# Patient Record
Sex: Male | Born: 1980 | Race: White | Hispanic: No | Marital: Single | Smoking: Current every day smoker
Health system: Southern US, Community
[De-identification: ages and names within clinical notes are randomized; demographics above are authoritative.]

---

## 2013-01-31 ENCOUNTER — Emergency Department (HOSPITAL_BASED_OUTPATIENT_CLINIC_OR_DEPARTMENT_OTHER): Payer: Worker's Compensation

## 2013-01-31 ENCOUNTER — Encounter (HOSPITAL_BASED_OUTPATIENT_CLINIC_OR_DEPARTMENT_OTHER): Payer: Self-pay | Admitting: Emergency Medicine

## 2013-01-31 ENCOUNTER — Emergency Department (HOSPITAL_BASED_OUTPATIENT_CLINIC_OR_DEPARTMENT_OTHER)
Admission: EM | Admit: 2013-01-31 | Discharge: 2013-01-31 | Disposition: A | Discharge status: Home / Self Care | Payer: Worker's Compensation | Attending: Emergency Medicine | Admitting: Emergency Medicine

## 2013-01-31 DIAGNOSIS — Z23 Encounter for immunization: Secondary | ICD-10-CM | POA: Insufficient documentation

## 2013-01-31 DIAGNOSIS — S61409A Unspecified open wound of unspecified hand, initial encounter: Secondary | ICD-10-CM | POA: Insufficient documentation

## 2013-01-31 DIAGNOSIS — Y929 Unspecified place or not applicable: Secondary | ICD-10-CM | POA: Insufficient documentation

## 2013-01-31 DIAGNOSIS — W298XXA Contact with other powered powered hand tools and household machinery, initial encounter: Secondary | ICD-10-CM | POA: Insufficient documentation

## 2013-01-31 DIAGNOSIS — F17200 Nicotine dependence, unspecified, uncomplicated: Secondary | ICD-10-CM | POA: Insufficient documentation

## 2013-01-31 DIAGNOSIS — S61412A Laceration without foreign body of left hand, initial encounter: Secondary | ICD-10-CM

## 2013-01-31 DIAGNOSIS — Y939 Activity, unspecified: Secondary | ICD-10-CM | POA: Insufficient documentation

## 2013-01-31 MED ORDER — CEPHALEXIN 250 MG PO CAPS
500.0000 mg | ORAL_CAPSULE | Freq: Once | ORAL | Status: AC
  Administered 2013-01-31: 500 mg via ORAL
  Filled 2013-01-31: qty 2

## 2013-01-31 MED ORDER — HYDROCODONE-ACETAMINOPHEN 5-325 MG PO TABS
2.0000 | ORAL_TABLET | Freq: Once | ORAL | Status: AC
  Administered 2013-01-31: 2 via ORAL
  Filled 2013-01-31: qty 2

## 2013-01-31 MED ORDER — CEPHALEXIN 500 MG PO CAPS: 500.0000 mg | ORAL_CAPSULE | Freq: Four times a day (QID) | ORAL | Status: AC

## 2013-01-31 MED ORDER — HYDROCODONE-ACETAMINOPHEN 5-325 MG PO TABS: 2.0000 | ORAL_TABLET | ORAL | Status: AC | PRN

## 2013-01-31 MED ORDER — TETANUS-DIPHTH-ACELL PERTUSSIS 5-2.5-18.5 LF-MCG/0.5 IM SUSP
0.5000 mL | Freq: Once | INTRAMUSCULAR | Status: AC
  Administered 2013-01-31: 0.5 mL via INTRAMUSCULAR
  Filled 2013-01-31: qty 0.5

## 2013-01-31 MED ORDER — LIDOCAINE HCL 2 % IJ SOLN
INTRAMUSCULAR | Status: AC
  Administered 2013-01-31: 40 mg
  Filled 2013-01-31: qty 20

## 2013-01-31 NOTE — ED Notes (Signed)
Lac to back of hand w miter saw   Bleeding controlled

## 2013-01-31 NOTE — ED Notes (Addendum)
Left hand laceration on saw-questionable blade vs wood kick back-jagged lac noted to dorsal side-bleeding controlled-gauze wrap placed in triage

## 2013-01-31 NOTE — ED Provider Notes (Signed)
CSN: [id number]     Arrival date & time 01/31/13  1824 History   First MD Initiated Contact with Patient 01/31/13 2229     Chief Complaint  Patient presents with  . Extremity Laceration   (Consider location/radiation/quality/duration/timing/severity/associated sxs/prior Treatment) Patient is a 32 y.o. male presenting with skin laceration. The history is provided by the patient. No language interpreter was used.  Laceration Location:  Hand Length (cm):  3 Depth:  Through underlying tissue Quality: straight   Bleeding: controlled with pressure   Injury mechanism: miter saw. Pain details:    Quality:  Aching   Severity:  Moderate Relieved by:  Nothing Worsened by:  Nothing tried Tetanus status:  Up to date   History reviewed. No pertinent past medical history. History reviewed. No pertinent past surgical history. No family history on file. History  Substance Use Topics  . Smoking status: Current Every Day Smoker  . Smokeless tobacco: Not on file  . Alcohol Use: No    Review of Systems  Skin: Positive for wound.  All other systems reviewed and are negative.    Allergies  Review of patient's allergies indicates no known allergies.  Home Medications  No current outpatient prescriptions on file. BP 165/83  Pulse 88  Temp(Src) 98.7 F (37.1 C) (Oral)  Resp 16  Ht 5\' 10"  (1.778 m)  Wt 250 lb (113.399 kg)  BMI 35.87 kg/m2  SpO2 100% Physical Exam  Nursing note and vitals reviewed. Constitutional: He is oriented to person, place, and time. He appears well-developed and well-nourished.  HENT:  Head: Normocephalic and atraumatic.  Musculoskeletal: He exhibits tenderness.  3cm laceration dorsal aspect hand below 3rd   Neurological: He is alert and oriented to person, place, and time. He has normal reflexes.  Skin: Skin is warm.  Psychiatric: He has a normal mood and affect.    ED Course  LACERATION REPAIR Date/Time: 01/31/2013 10:58 PM Performed by: Elson Areas Authorized by: Elson Areas Consent: Verbal consent obtained. Consent given by: patient Patient understanding: patient states understanding of the procedure being performed Patient identity confirmed: verbally with patient Body area: upper extremity Location details: left hand Laceration length: 3 cm Foreign bodies: no foreign bodies Tendon involvement: complex Nerve involvement: none Vascular damage: no Anesthesia: local infiltration Local anesthetic: lidocaine 2% without epinephrine Preparation: Patient was prepped and draped in the usual sterile fashion. Irrigation solution: saline Irrigation method: syringe Amount of cleaning: standard Debridement: none Degree of undermining: none Skin closure: 5-0 Prolene Number of sutures: 4 Technique: simple Approximation: loose Patient tolerance: Patient tolerated the procedure well with no immediate complications. Comments: Pt has full extensor tendon laceration.  I spoke to Dr. Izora Ribas who advised close skin loosely   (including critical care time) Labs Review Labs Reviewed - No data to display Imaging Review Dg Hand Complete Left  01/31/2013   CLINICAL DATA:  Laceration  EXAM: LEFT HAND - COMPLETE 3+ VIEW  COMPARISON:  None.  FINDINGS: There is no evidence of fracture or dislocation. There is no evidence of arthropathy or other focal bone abnormality. Soft tissues are unremarkable. No subcutaneous gas or radiodense foreign body.  IMPRESSION: Negative.   Electronically Signed   By: Oley Balm M.D.   On: 01/31/2013 20:02    EKG Interpretation   None       MDM   1. Laceration of left hand with complication, initial encounter    Keflex and hydrocodone   Pt placed in a splint.  I advised call Dr. Izora Ribas in the am   Elson Areas, PA-C 01/31/13 2305  Lonia Skinner [place], [place] 01/31/13 [phone]

## 2013-02-01 NOTE — ED Provider Notes (Signed)
Medical screening examination/treatment/procedure(s) were performed by non-physician practitioner and as supervising physician I was immediately available for consultation/collaboration.  EKG Interpretation   None         Pritika Alvarez S Jermery Caratachea, MD 02/01/13 1045 

## 2015-08-21 IMAGING — CR DG HAND COMPLETE 3+V*L*
3 series · 3 of 3 positions shown · non-contrast
Comparison: None.

CLINICAL DATA: Laceration

EXAM:
LEFT HAND - COMPLETE 3+ VIEW

[x hand pa left]
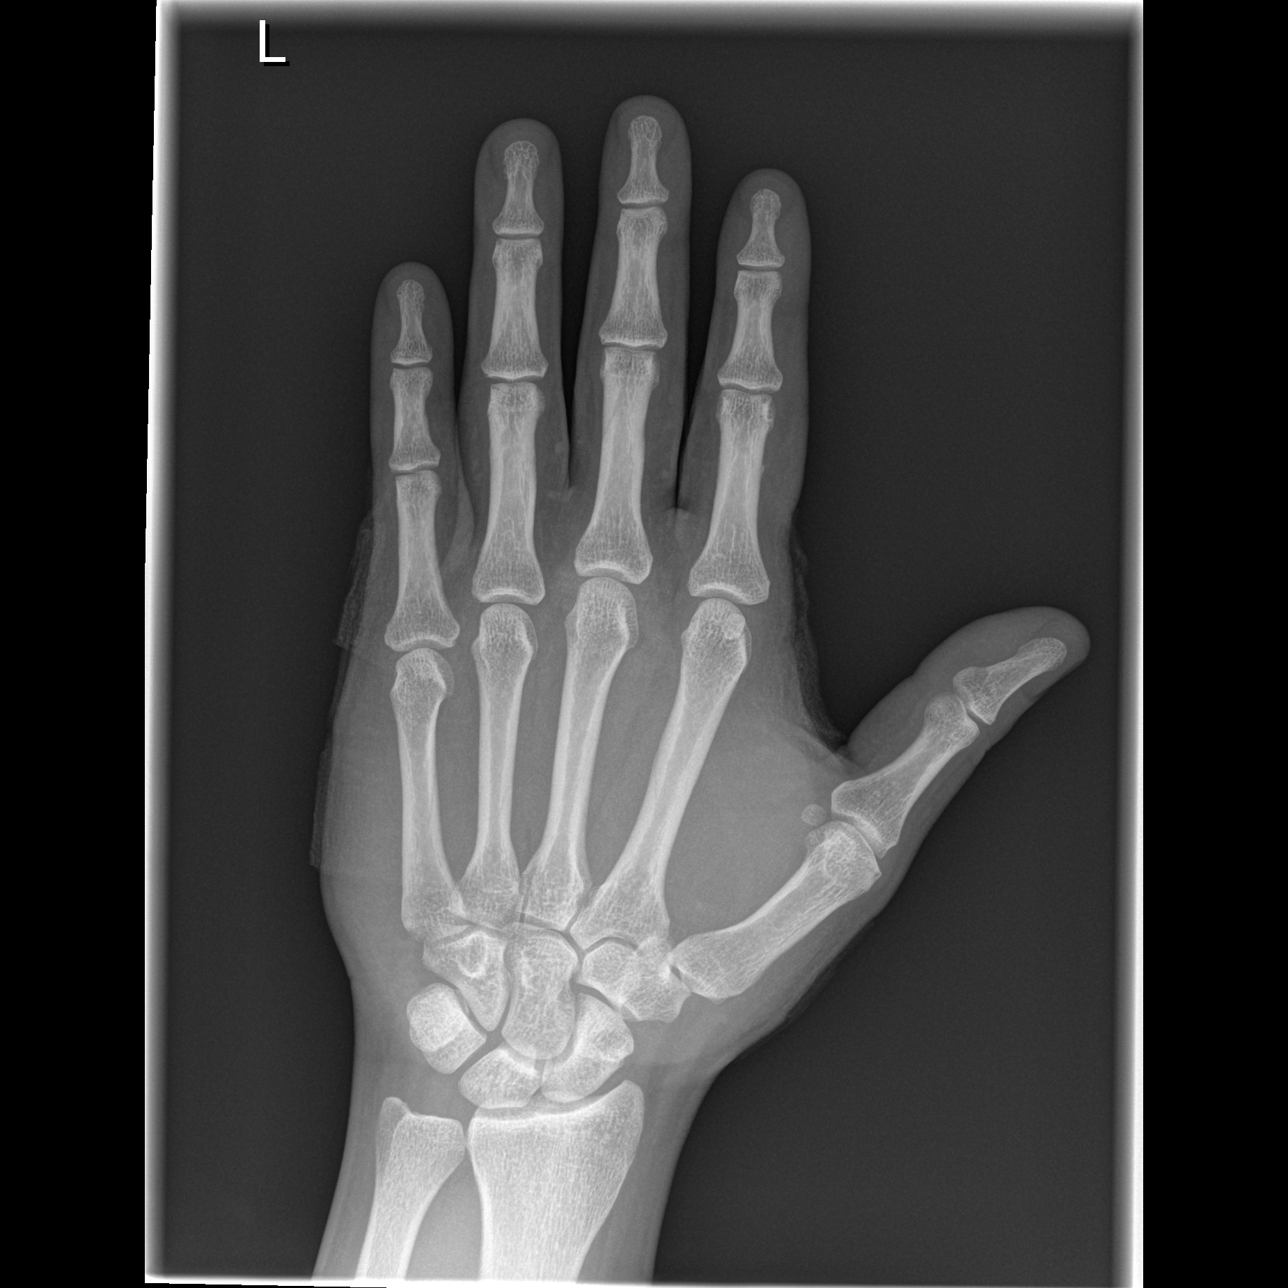

[x hand oblique left]
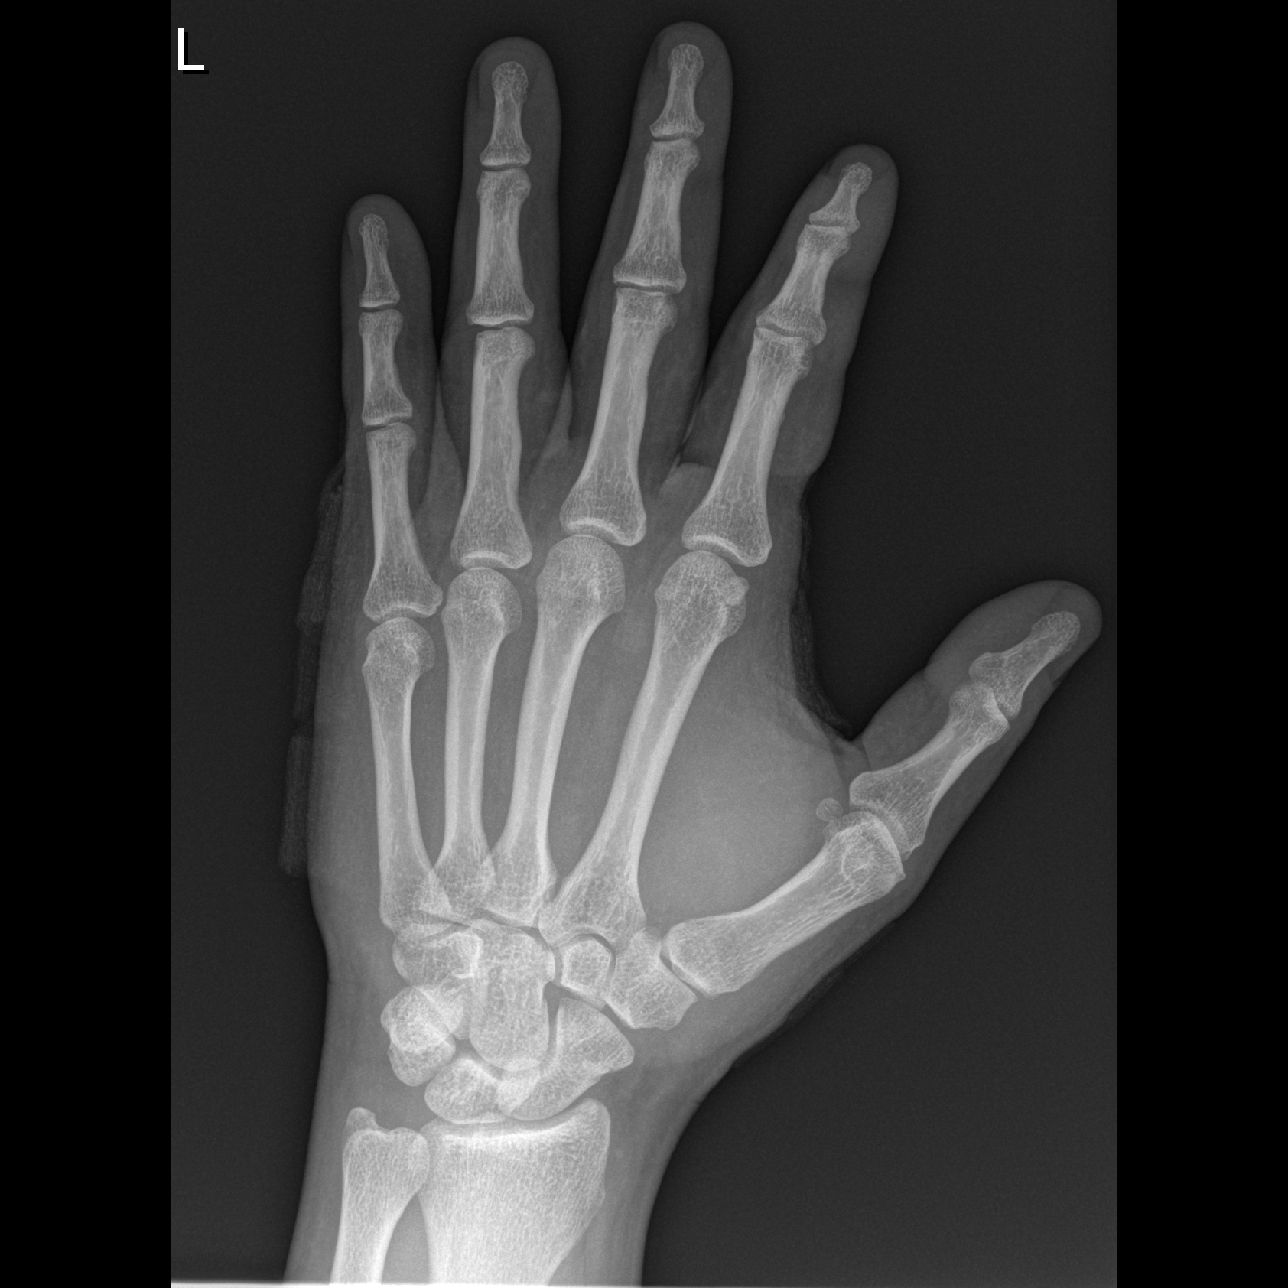

[x hand lat left *]
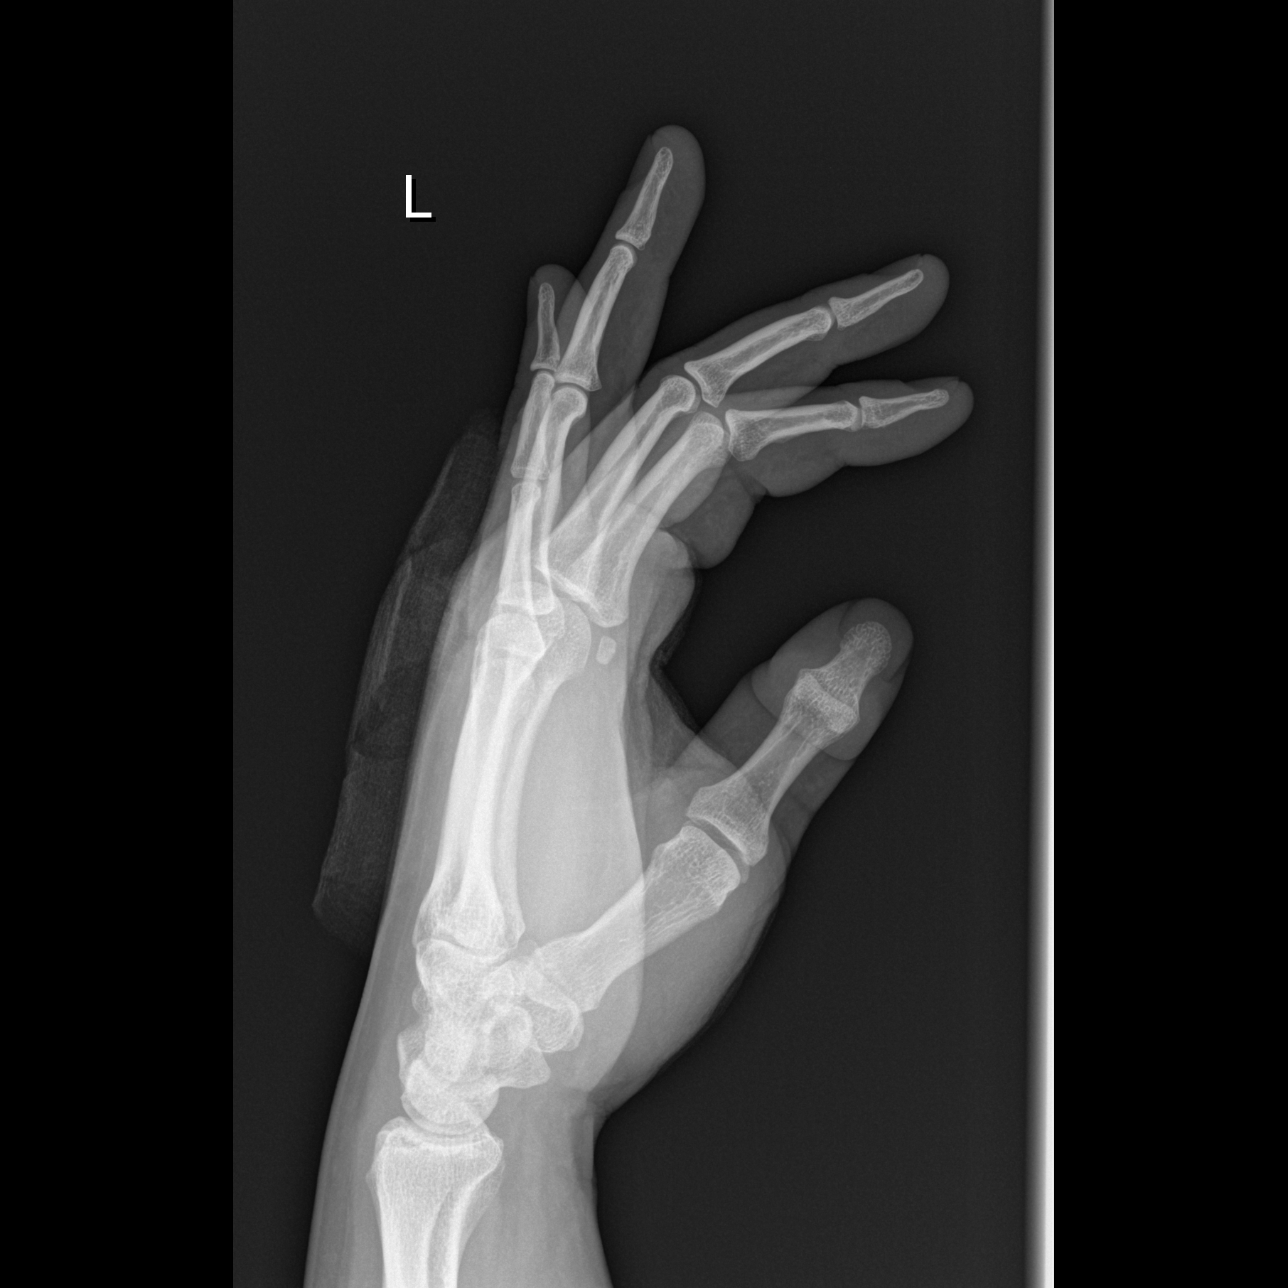

[3 of 3 positions shown; findings below may reference images not displayed]

FINDINGS: There is no evidence of fracture or dislocation. There is no
evidence of arthropathy or other focal bone abnormality. Soft
tissues are unremarkable. No subcutaneous gas or radiodense foreign
body.
IMPRESSION: Negative.

## 2018-02-09 ENCOUNTER — Other Ambulatory Visit: Payer: Self-pay

## 2018-02-09 ENCOUNTER — Emergency Department (HOSPITAL_COMMUNITY): Payer: 59

## 2018-02-09 ENCOUNTER — Emergency Department (HOSPITAL_COMMUNITY)
Admission: EM | Admit: 2018-02-09 | Discharge: 2018-02-09 | Disposition: A | Discharge status: Home / Self Care | Payer: 59 | Attending: Emergency Medicine | Admitting: Emergency Medicine

## 2018-02-09 ENCOUNTER — Encounter (HOSPITAL_COMMUNITY): Payer: Self-pay | Admitting: *Deleted

## 2018-02-09 DIAGNOSIS — Y939 Activity, unspecified: Secondary | ICD-10-CM | POA: Diagnosis not present

## 2018-02-09 DIAGNOSIS — S43005A Unspecified dislocation of left shoulder joint, initial encounter: Secondary | ICD-10-CM

## 2018-02-09 DIAGNOSIS — F17210 Nicotine dependence, cigarettes, uncomplicated: Secondary | ICD-10-CM | POA: Insufficient documentation

## 2018-02-09 DIAGNOSIS — Y929 Unspecified place or not applicable: Secondary | ICD-10-CM | POA: Insufficient documentation

## 2018-02-09 DIAGNOSIS — X58XXXA Exposure to other specified factors, initial encounter: Secondary | ICD-10-CM | POA: Diagnosis not present

## 2018-02-09 DIAGNOSIS — S43015A Anterior dislocation of left humerus, initial encounter: Secondary | ICD-10-CM | POA: Insufficient documentation

## 2018-02-09 DIAGNOSIS — Y999 Unspecified external cause status: Secondary | ICD-10-CM | POA: Diagnosis not present

## 2018-02-09 MED ORDER — PROPOFOL 10 MG/ML IV BOLUS
INTRAVENOUS | Status: AC | PRN
  Administered 2018-02-09: 60 mg via INTRAVENOUS

## 2018-02-09 MED ORDER — FENTANYL CITRATE (PF) 100 MCG/2ML IJ SOLN
50.0000 ug | Freq: Once | INTRAMUSCULAR | Status: AC
  Administered 2018-02-09: 50 ug via INTRAVENOUS
  Filled 2018-02-09: qty 2

## 2018-02-09 MED ORDER — PROPOFOL 10 MG/ML IV BOLUS
0.5000 mg/kg | Freq: Once | INTRAVENOUS | Status: AC
  Administered 2018-02-09: 59 mg via INTRAVENOUS
  Filled 2018-02-09: qty 20

## 2018-02-09 NOTE — ED Notes (Signed)
Bed: WA07 Expected date:  Expected time:  Means of arrival:  Comments: Triage 1  

## 2018-02-09 NOTE — Discharge Instructions (Signed)
Please keep sling in place.  Use cold therapy and ibuprofen. Call your orthopedist for recheck next week.

## 2018-02-09 NOTE — ED Provider Notes (Signed)
[facility]-EMERGENCY DEPT Provider Note   CSN: [id number] Arrival date & time: 02/09/18  1539     History   Chief Complaint Chief Complaint  Patient presents with  . shoulder dislocation    HPI Miguel Nguyen is a 38 y.o. male.  HPI  38 year old male presents today complaining of left shoulder pain and dislocation.  He states he has dislocated multiple times over the past 10 to 15 years and normally is able to relocate himself.  He has been seen by an orthopedist although he cannot recall the name.  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  He feels like he is having some tingling in his fingers.  He denies any other injury.  History reviewed. No pertinent past medical history.  There are no active problems to display for this patient.   History reviewed. No pertinent surgical history.      Home Medications    Prior to Admission medications   Medication Sig Start Date End Date Taking? Authorizing Provider  cephALEXin (KEFLEX) 500 MG capsule Take 1 capsule (500 mg total) by mouth 4 (four) times daily. 01/31/13   Elson Areas, PA-C  HYDROcodone-acetaminophen (NORCO/VICODIN) 5-325 MG per tablet Take 2 tablets by mouth every 4 (four) hours as needed. 01/31/13   Elson Areas, PA-C    Family History No family history on file.  Social History Social History   Tobacco Use  . Smoking status: Current Every Day Smoker  . Smokeless tobacco: Never Used  Substance Use Topics  . Alcohol use: No  . Drug use: No     Allergies   Patient has no known allergies.   Review of Systems Review of Systems  All other systems reviewed and are negative.    Physical Exam Updated Vital Signs BP (!) 188/122   Pulse (!) 104   Temp 98.8 F (37.1 C) (Oral)   Resp (!) 22   Ht 1.829 m (6')   Wt 117.9 kg   SpO2 100%   BMI 35.26 kg/m   Physical Exam Vitals signs and nursing note reviewed.  Constitutional:      General: He is in acute distress.    HENT:     Head: Normocephalic.     Nose: Nose normal.     Mouth/Throat:     Mouth: Mucous membranes are moist.  Eyes:     Pupils: Pupils are equal, round, and reactive to light.  Neck:     Musculoskeletal: Normal range of motion.  Musculoskeletal:     Comments: Left shoulder with deformity Left arm held in internal rotation and abduction Pulses intact Sensation intact throughout upper arm Full active range of motion of fingers and wrist  Skin:    General: Skin is warm and dry.     Capillary Refill: Capillary refill takes less than 2 seconds.  Neurological:     General: No focal deficit present.     Mental Status: He is alert.  Psychiatric:        Mood and Affect: Mood normal.      ED Treatments / Results  Labs (all labs ordered are listed, but only abnormal results are displayed) Labs Reviewed - No data to display  EKG None  Radiology Dg Shoulder Left  Result Date: 02/09/2018 CLINICAL DATA:  Left shoulder pain EXAM: LEFT SHOULDER - 2+ VIEW COMPARISON:  None. FINDINGS: Anterior dislocation of the left humeral head with respect to the glenoid fossa. Questionable fracture lucency in the  distal clavicle on one view. IMPRESSION: 1. Anterior dislocation of the left humeral head with respect to the glenoid fossa 2. Questionable fracture lucency in the distal left clavicle on one view. Electronically Signed   By: Jasmine PangKim  Fujinaga M.D.   On: 02/09/2018 16:05    Procedures .Sedation Date/Time: 02/09/2018 5:06 PM Performed by: Margarita Grizzleay, Chinmay Squier, MD Authorized by: Margarita Grizzleay, Maryann Mccall, MD   Consent:    Consent obtained:  Verbal   Consent given by:  Patient   Risks discussed:  Allergic reaction, dysrhythmia, inadequate sedation, nausea, prolonged hypoxia resulting in organ damage, prolonged sedation necessitating reversal, respiratory compromise necessitating ventilatory assistance and intubation and vomiting   Alternatives discussed:  Analgesia without sedation, anxiolysis and regional  anesthesia Universal protocol:    Procedure explained and questions answered to patient or proxy's satisfaction: yes     Relevant documents present and verified: yes     Test results available and properly labeled: yes     Imaging studies available: yes     Required blood products, implants, devices, and special equipment available: yes     Site/side marked: yes     Immediately prior to procedure a time out was called: yes     Patient identity confirmation method:  Verbally with patient and arm band Indications:    Procedure necessitating sedation performed by:  Physician performing sedation Pre-sedation assessment:    Time since last food or drink:  4 hours   NPO status caution: urgency dictates proceeding with non-ideal NPO status     ASA classification: class 1 - normal, healthy patient     Neck mobility: normal     Mouth opening:  3 or more finger widths   Thyromental distance:  4 finger widths   Mallampati score:  I - soft palate, uvula, fauces, pillars visible   Pre-sedation assessments completed and reviewed: airway patency, cardiovascular function, hydration status, mental status, nausea/vomiting, pain level, respiratory function and temperature     Pre-sedation assessment completed:  02/09/2018 6:04 PM Immediate pre-procedure details:    Reassessment: Patient reassessed immediately prior to procedure     Reviewed: vital signs, relevant labs/tests and NPO status     Verified: bag valve mask available, emergency equipment available, intubation equipment available, IV patency confirmed, oxygen available and suction available   Procedure details (see MAR for exact dosages):    Preoxygenation:  Nasal cannula   Sedation:  Propofol   Intra-procedure monitoring:  Blood pressure monitoring, cardiac monitor, continuous pulse oximetry, frequent LOC assessments, frequent vital sign checks and continuous capnometry   Intra-procedure events: none     Total Provider sedation time (minutes):   15 Post-procedure details:    Attendance: Constant attendance by certified staff until patient recovered     Recovery: Patient returned to pre-procedure baseline     Post-sedation assessments completed and reviewed: airway patency, cardiovascular function, hydration status, mental status, nausea/vomiting, pain level, respiratory function and temperature     Patient is stable for discharge or admission: yes     Patient tolerance:  Tolerated well, no immediate complications .Ortho Injury Treatment Date/Time: 02/09/2018 5:07 PM Performed by: Margarita Grizzleay, Maleaha Hughett, MD Authorized by: Margarita Grizzleay, Keldric Poyer, MD   Consent:    Consent obtained:  Written   Consent given by:  Patient   Risks discussed:  Fracture, nerve damage, vascular damage and irreducible dislocation   Alternatives discussed:  No treatment Universal protocol:    Immediately prior to procedure a time out was called: yes     Patient identity  confirmed:  Verbally with patientInjury location: upper arm Injury type: dislocation Pre-procedure neurovascular assessment: neurovascularly intact Pre-procedure distal perfusion: normal Pre-procedure neurological function: normal Pre-procedure range of motion: reduced  Anesthesia: Local anesthesia used: no  Patient sedated: Yes. Refer to sedation procedure documentation for details of sedation. Manipulation performed: no Immobilization: sling Supplies used: sling. Post-procedure neurovascular assessment: post-procedure neurovascularly intact Post-procedure distal perfusion: normal Post-procedure neurological function: normal Post-procedure range of motion: improved Patient tolerance: Patient tolerated the procedure well with no immediate complications    (including critical care time)  Medications Ordered in ED Medications  fentaNYL (SUBLIMAZE) injection 50 mcg (has no administration in time range)  propofol (DIPRIVAN) 10 mg/mL bolus/IV push 59 mg (has no administration in time range)      Initial Impression / Assessment and Plan / ED Course  I have reviewed the triage vital signs and the nursing notes.  Pertinent labs & imaging results that were available during my care of the patient were reviewed by me and considered in my medical decision making (see chart for details).    Postreduction x-Mung Rinker shows good reduction  Final Clinical Impressions(s) / ED Diagnoses   Final diagnoses:  Shoulder dislocation, left, initial encounter    ED Discharge Orders    None       Margarita Grizzle, MD 02/09/18 [phone]

## 2018-02-09 NOTE — ED Triage Notes (Signed)
Reaching overhead, left shoulder popped out. He has had this happen before and was able to pop it back in.

## 2020-08-29 IMAGING — DX DG SHOULDER 2+V*L*
2 series · 2 of 2 positions shown · non-contrast
Comparison: [DATE] [DATE], [DATE] [DATE] p.m..

CLINICAL DATA: Status post reduction of left shoulder.

EXAM:
LEFT SHOULDER - 2+ VIEW

[shoulder obl]
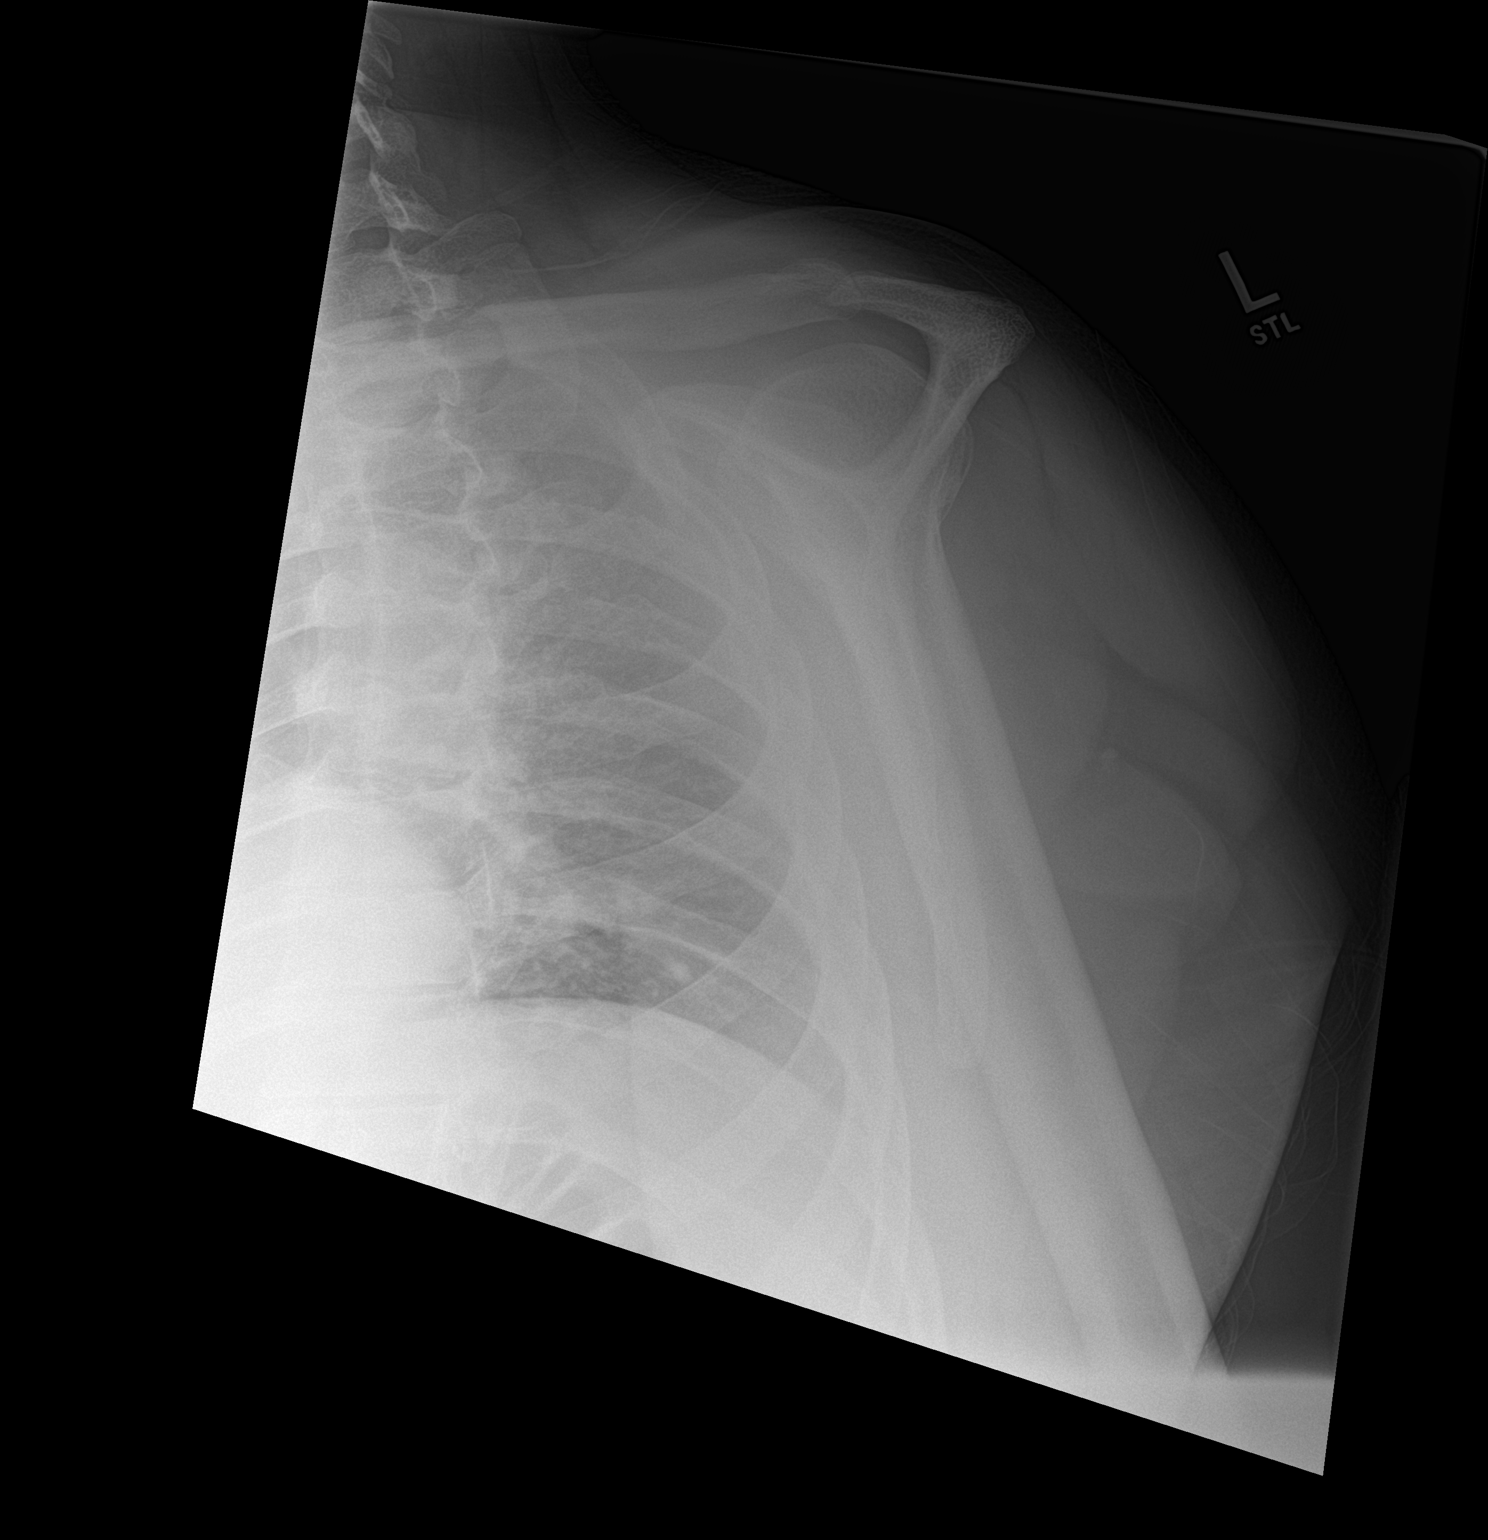

[shoulder ap]
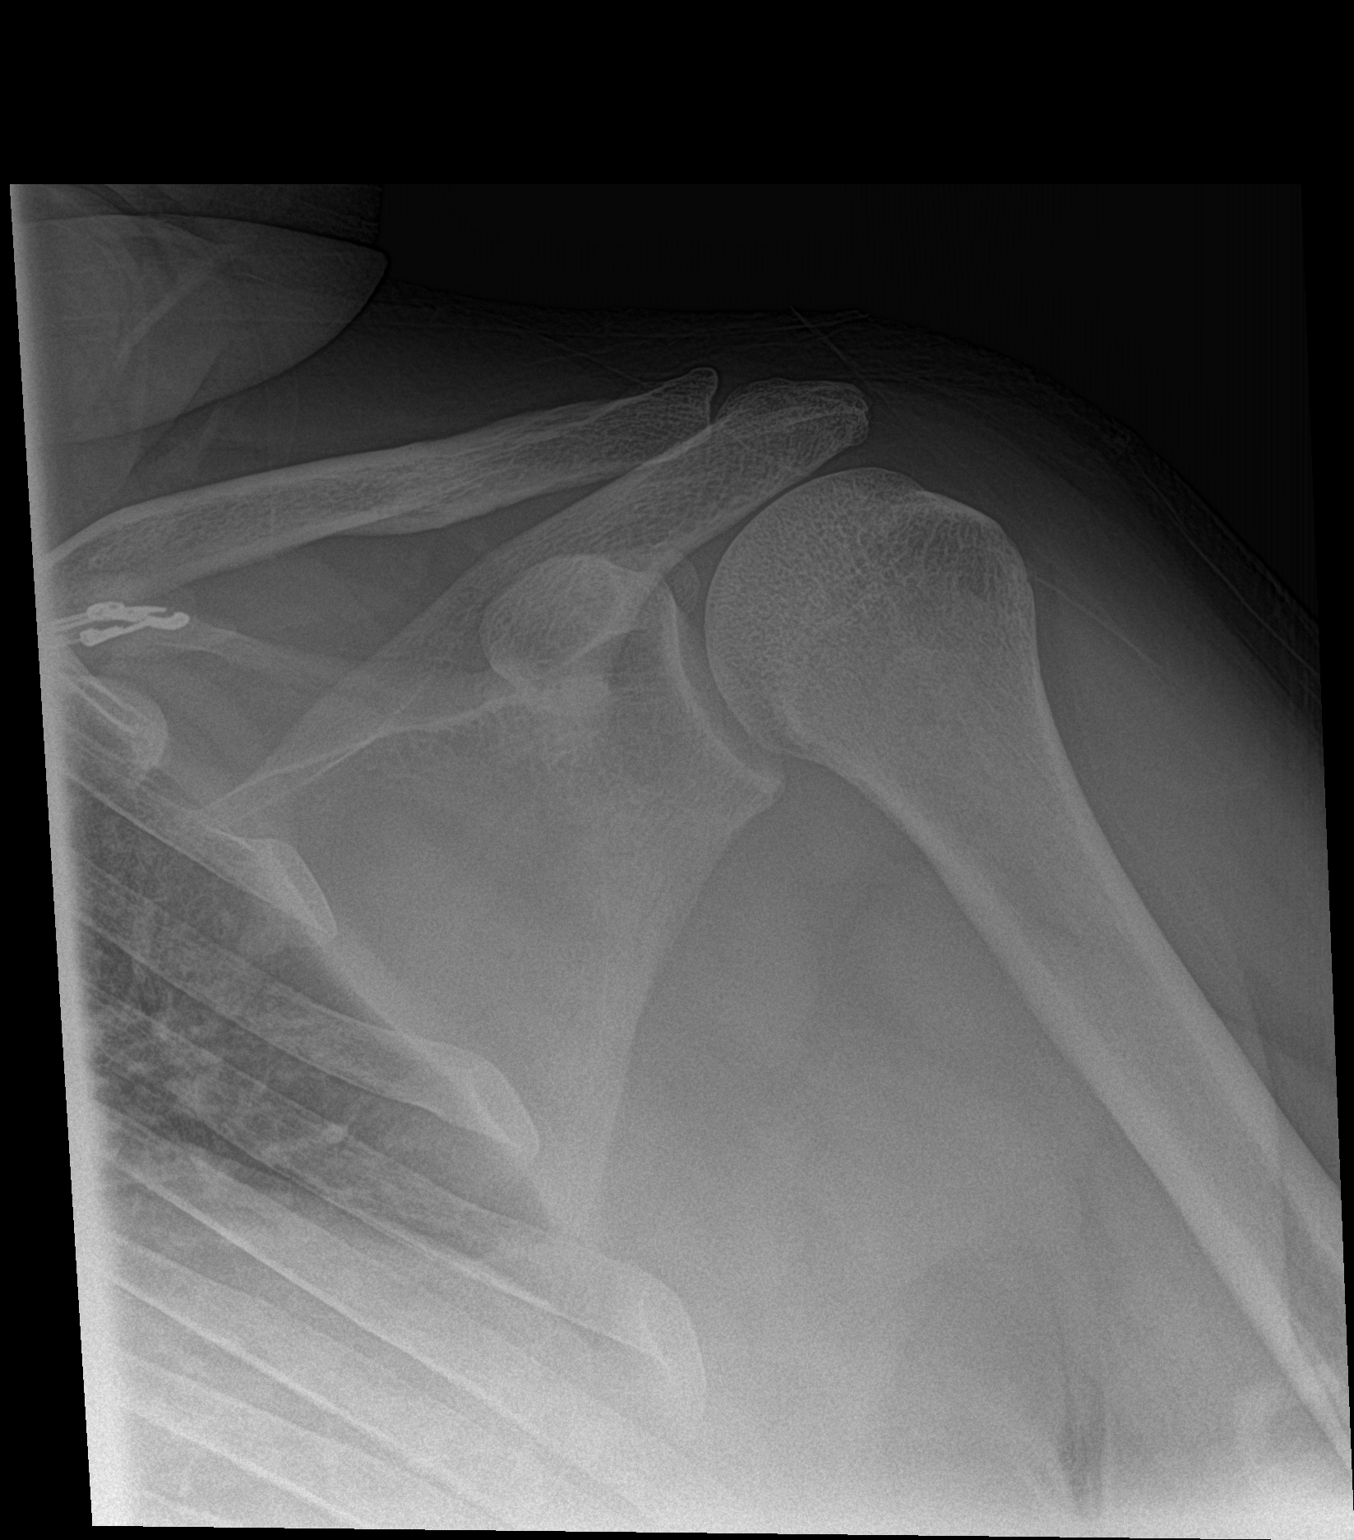

[2 of 2 positions shown; findings below may reference images not displayed]

FINDINGS: The previously noted dislocation left shoulder has been reduced. No
fracture is identified in the left humerus or glenoid. The
previously noted lucency in the distal left clavicle is not well
appreciated on the current exam.
IMPRESSION: Previously noted left shoulder dislocation has been reduced.

The previously noted lucency in the distal left clavicle is not well
appreciated on current exam.
